# Patient Record
Sex: Male | Born: 1970
Health system: Southern US, Community
[De-identification: ages and names within clinical notes are randomized; demographics above are authoritative.]

## PROBLEM LIST (undated history)

## (undated) DIAGNOSIS — G43909 Migraine, unspecified, not intractable, without status migrainosus: Secondary | ICD-10-CM

---

## 2016-08-07 MED FILL — tiZANidine HCL 4 MG TABS: 4 | 90 days supply | Qty: 270 | Fill #0

## 2016-08-07 MED FILL — CYPROHEPTADINE 4 MG TABLET: 4 | 90 days supply | Qty: 180 | Fill #0

## 2016-10-20 MED FILL — VERAPAMIL ER 120 MG TABLET: 120 | 450 days supply | Qty: 450 | Fill #0

## 2016-11-10 MED FILL — CYPROHEPTADINE 4 MG TABLET: 4 | 90 days supply | Qty: 270 | Fill #0

## 2016-11-10 MED FILL — tiZANidine HCL 4 MG TABS: 4 | 90 days supply | Qty: 180 | Fill #0

## 2017-01-24 DIAGNOSIS — G44029 Chronic cluster headache, not intractable: Secondary | ICD-10-CM | POA: Diagnosis not present

## 2017-01-24 MED FILL — CYPROHEPTADINE HCL 4 MG TAB: 4 | 90 days supply | Qty: 180 | Fill #0

## 2017-01-24 MED FILL — tiZANidine HCL 4 MG TABS: 4 | 90 days supply | Qty: 270 | Fill #0

## 2017-01-24 MED FILL — VERAPAMIL ER 120 MG TABLET: 120 | 90 days supply | Qty: 450 | Fill #0

## 2017-04-24 MED FILL — tiZANidine HCL 4 MG TABS: 4 | 90 days supply | Qty: 270 | Fill #1

## 2017-04-24 MED FILL — CYPROHEPTADINE HCL 4 MG TAB: 4 | 90 days supply | Qty: 180 | Fill #1

## 2017-04-24 MED FILL — VERAPAMIL ER 120 MG TABLET: 120 | 90 days supply | Qty: 450 | Fill #1

## 2017-07-25 MED FILL — CYPROHEPTADINE HCL 4 MG TAB: 4 | 90 days supply | Qty: 180 | Fill #2

## 2017-07-25 MED FILL — VERAPAMIL ER 120 MG TABLET: 120 | 90 days supply | Qty: 450 | Fill #2

## 2017-07-25 MED FILL — tiZANidine HCL 4 MG TABS: 4 | 90 days supply | Qty: 270 | Fill #2

## 2017-09-11 MED FILL — ZOMIG 5 MG NASAL SPRAY: 5 | 30 days supply | Qty: 6 | Fill #0

## 2017-10-05 MED FILL — ZOMIG 5 MG NASAL SPRAY: 5 | 30 days supply | Qty: 6 | Fill #1

## 2017-10-17 MED FILL — CYPROHEPTADINE HCL 4 MG TAB: 4 | 90 days supply | Qty: 180 | Fill #3

## 2017-10-17 MED FILL — VERAPAMIL ER 120 MG TABLET: 120 | 90 days supply | Qty: 450 | Fill #3

## 2017-10-17 MED FILL — tiZANidine HCL 4 MG TABS: 4 | 90 days supply | Qty: 270 | Fill #3

## 2018-01-21 DIAGNOSIS — G44029 Chronic cluster headache, not intractable: Secondary | ICD-10-CM | POA: Diagnosis not present

## 2018-01-21 MED FILL — VERAPAMIL ER 120 MG TABLET: 120 | 90 days supply | Qty: 450 | Fill #0

## 2018-01-21 MED FILL — tiZANidine HCL 4 MG TABS: 4 | 90 days supply | Qty: 270 | Fill #0

## 2018-01-21 MED FILL — CYPROHEPTADINE HCL 4 MG TAB: 4 | 90 days supply | Qty: 180 | Fill #0

## 2018-03-27 DIAGNOSIS — Z7689 Persons encountering health services in other specified circumstances: Secondary | ICD-10-CM | POA: Diagnosis not present

## 2018-03-27 DIAGNOSIS — G44019 Episodic cluster headache, not intractable: Secondary | ICD-10-CM | POA: Diagnosis not present

## 2018-03-27 DIAGNOSIS — Z0000 Encounter for general adult medical examination without abnormal findings: Secondary | ICD-10-CM | POA: Diagnosis not present

## 2018-03-29 DIAGNOSIS — Z0000 Encounter for general adult medical examination without abnormal findings: Secondary | ICD-10-CM | POA: Diagnosis not present

## 2018-03-29 DIAGNOSIS — Z125 Encounter for screening for malignant neoplasm of prostate: Secondary | ICD-10-CM | POA: Diagnosis not present

## 2018-04-30 MED FILL — VERAPAMIL ER 120 MG TABLET: 120 | 90 days supply | Qty: 450 | Fill #1

## 2018-04-30 MED FILL — CYPROHEPTADINE 4 MG TABLET: 4 | 90 days supply | Qty: 180 | Fill #1

## 2018-04-30 MED FILL — tiZANidine HCL 4 MG TABS: 4 | 90 days supply | Qty: 270 | Fill #1

## 2018-07-29 MED FILL — CYPROHEPTADINE 4 MG TABLET: 4 | 90 days supply | Qty: 180 | Fill #2

## 2018-07-29 MED FILL — tiZANidine HCL 4 MG TABS: 4 | 90 days supply | Qty: 270 | Fill #2

## 2018-07-29 MED FILL — VERAPAMIL ER 120 MG TABLET: 120 | 90 days supply | Qty: 450 | Fill #2

## 2018-10-28 MED FILL — CYPROHEPTADINE 4 MG TABLET: 4 | 90 days supply | Qty: 180 | Fill #3

## 2018-10-28 MED FILL — VERAPAMIL ER 120 MG TABLET: 120 | 90 days supply | Qty: 450 | Fill #3

## 2018-10-28 MED FILL — tiZANidine HCL 4 MG TABS: 4 | 90 days supply | Qty: 270 | Fill #3

## 2019-01-27 MED FILL — CYPROHEPTADINE HCL 4 MG TAB: 4 | 90 days supply | Qty: 180 | Fill #0

## 2019-01-27 MED FILL — VERAPAMIL ER 120 MG TABLET: 120 | 90 days supply | Qty: 450 | Fill #0

## 2019-01-27 MED FILL — tiZANidine HCL 4 MG TABS: 4 | 90 days supply | Qty: 270 | Fill #0

## 2019-03-18 DIAGNOSIS — G44029 Chronic cluster headache, not intractable: Secondary | ICD-10-CM | POA: Diagnosis not present

## 2019-04-03 DIAGNOSIS — R635 Abnormal weight gain: Secondary | ICD-10-CM | POA: Diagnosis not present

## 2019-04-03 DIAGNOSIS — Z0000 Encounter for general adult medical examination without abnormal findings: Secondary | ICD-10-CM | POA: Diagnosis not present

## 2019-04-03 DIAGNOSIS — G44009 Cluster headache syndrome, unspecified, not intractable: Secondary | ICD-10-CM | POA: Diagnosis not present

## 2019-04-03 DIAGNOSIS — F17220 Nicotine dependence, chewing tobacco, uncomplicated: Secondary | ICD-10-CM | POA: Diagnosis not present

## 2019-04-03 MED FILL — PHENTERMINE 37.5 MG TABLET: 37.5 | 90 days supply | Qty: 90 | Fill #0

## 2019-04-04 MED FILL — LIDOCAINE HCL 4 % SOLN: 4 | 30 days supply | Qty: 50 | Fill #0

## 2019-04-12 ENCOUNTER — Encounter: Payer: Self-pay | Admitting: Emergency Medicine

## 2019-04-12 ENCOUNTER — Emergency Department: Payer: 59

## 2019-04-12 ENCOUNTER — Emergency Department
Admission: EM | Admit: 2019-04-12 | Discharge: 2019-04-12 | Disposition: A | Discharge status: Home / Self Care | Payer: 59 | Attending: Emergency Medicine | Admitting: Emergency Medicine

## 2019-04-12 ENCOUNTER — Other Ambulatory Visit: Payer: Self-pay

## 2019-04-12 DIAGNOSIS — R030 Elevated blood-pressure reading, without diagnosis of hypertension: Secondary | ICD-10-CM | POA: Diagnosis not present

## 2019-04-12 DIAGNOSIS — Z79899 Other long term (current) drug therapy: Secondary | ICD-10-CM | POA: Diagnosis not present

## 2019-04-12 DIAGNOSIS — R1310 Dysphagia, unspecified: Secondary | ICD-10-CM | POA: Diagnosis not present

## 2019-04-12 DIAGNOSIS — F17228 Nicotine dependence, chewing tobacco, with other nicotine-induced disorders: Secondary | ICD-10-CM | POA: Diagnosis not present

## 2019-04-12 DIAGNOSIS — R070 Pain in throat: Secondary | ICD-10-CM | POA: Insufficient documentation

## 2019-04-12 HISTORY — DX: Migraine, unspecified, not intractable, without status migrainosus: G43.909

## 2019-04-12 LAB — CBC
HCT: 46.1 % (ref 39.0–52.0)
Hemoglobin: 15.6 g/dL (ref 13.0–17.0)
MCH: 31.2 pg (ref 26.0–34.0)
MCHC: 33.8 g/dL (ref 30.0–36.0)
MCV: 92.2 fL (ref 80.0–100.0)
Platelets: 268 K/uL (ref 150–400)
RBC: 5 MIL/uL (ref 4.22–5.81)
RDW: 12 % (ref 11.5–15.5)
WBC: 10.8 K/uL — ABNORMAL HIGH (ref 4.0–10.5)
nRBC: 0 % (ref 0.0–0.2)

## 2019-04-12 LAB — BASIC METABOLIC PANEL WITH GFR
Anion gap: 13 (ref 5–15)
BUN: 12 mg/dL (ref 6–20)
CO2: 20 mmol/L — ABNORMAL LOW (ref 22–32)
Calcium: 9.2 mg/dL (ref 8.9–10.3)
Chloride: 101 mmol/L (ref 98–111)
Creatinine, Ser: 1.52 mg/dL — ABNORMAL HIGH (ref 0.61–1.24)
GFR calc Af Amer: >60 mL/min
GFR calc non Af Amer: 53 mL/min — ABNORMAL LOW
Glucose, Bld: 97 mg/dL (ref 70–99)
Potassium: 4 mmol/L (ref 3.5–5.1)
Sodium: 134 mmol/L — ABNORMAL LOW (ref 135–145)

## 2019-04-12 LAB — BASIC METABOLIC PANEL
Anion gap: 17 — ABNORMAL HIGH (ref 5–15)
BUN: 13 mg/dL (ref 6–20)
CO2: 18 mmol/L — ABNORMAL LOW (ref 22–32)
Calcium: 9.1 mg/dL (ref 8.9–10.3)
Chloride: 100 mmol/L (ref 98–111)
Creatinine, Ser: 1.44 mg/dL — ABNORMAL HIGH (ref 0.61–1.24)
GFR calc Af Amer: >60 mL/min (ref >60)
GFR calc non Af Amer: 57 mL/min — ABNORMAL LOW (ref >60)
Glucose, Bld: 105 mg/dL — ABNORMAL HIGH (ref 70–99)
Potassium: 4.1 mmol/L (ref 3.5–5.1)
Sodium: 135 mmol/L (ref 135–145)

## 2019-04-12 MED ORDER — DEXAMETHASONE SODIUM PHOSPHATE 10 MG/ML IJ SOLN
10.0000 mg | Freq: Once | INTRAMUSCULAR | Status: AC
  Administered 2019-04-12: 18:00:00 10 mg via INTRAVENOUS
  Filled 2019-04-12: qty 1

## 2019-04-12 MED ORDER — IOHEXOL 350 MG/ML SOLN: 75.0000 mL | Freq: Once | INTRAVENOUS | Status: DC | PRN

## 2019-04-12 MED ORDER — IOHEXOL 300 MG/ML  SOLN
75.0000 mL | Freq: Once | INTRAMUSCULAR | Status: AC | PRN
  Administered 2019-04-12: 75 mL via INTRAVENOUS

## 2019-04-12 NOTE — ED Provider Notes (Signed)
[facility] Emergency Department Provider Note   ____________________________________________    I have reviewed the triage vital signs and the nursing notes.   HISTORY  Chief Complaint Dysphagia     HPI Larry Price is a 48 y.o. male who presents with complaints of difficulty swallowing.  Patient reports for the last several hours he has had difficulty swallowing his saliva and has discomfort in his throat.  He reports it is not particularly painful and he is not had any fevers or chills.  He reports this is happened to him several times in the past but typically resolves relatively quickly.  He thinks it may be related to an esophageal spasm.  No nausea or vomiting.  No exposure to COVID-19 patients.  Past Medical History:  Diagnosis Date  . Migraines     There are no active problems to display for this patient.   History reviewed. No pertinent surgical history.  Prior to Admission medications   Medication Sig Start Date End Date Taking? Authorizing Provider  cyproheptadine (PERIACTIN) 4 MG tablet Take 8 mg by mouth Nightly.   Yes [provider]  phentermine (ADIPEX-P) 37.5 MG tablet Take 37.5 mg by mouth every morning. 04/03/19 05/03/19 Yes [provider]  tiZANidine (ZANAFLEX) 4 MG tablet Take 4 mg by mouth Nightly.   Yes [provider]  verapamil (CALAN-SR) 120 MG CR tablet Take 3 tablets by mouth. Pt takes 3 in the morning and 2 in the evening   Yes [provider]     Allergies Penicillins  History reviewed. No pertinent family history.  Social History Social History   Tobacco Use  . Smoking status: Never Smoker  . Smokeless tobacco: Current User  Substance Use Topics  . Alcohol use: Not Currently  . Drug use: Never    Review of Systems  Constitutional: No fever/chills Eyes: No visual changes.  ENT: As above Cardiovascular: Denies chest pain. Respiratory: Denies shortness of breath.  Gastrointestinal: No abdominal pain.  No nausea, no vomiting.   Genitourinary: Negative for dysuria. Musculoskeletal: Negative for back pain. Skin: Negative for rash. Neurological: Negative for headaches or weakness   ____________________________________________   PHYSICAL EXAM:  VITAL SIGNS: ED Triage Vitals  Enc Vitals Group     BP 04/12/19 1541 (!) 148/71     Pulse Rate 04/12/19 1541 97     Resp 04/12/19 1541 18     Temp 04/12/19 1541 97.8 F (36.6 C)     Temp Source 04/12/19 1541 Oral     SpO2 04/12/19 1541 97 %     Weight 04/12/19 1541 112.5 kg (248 lb)     Height 04/12/19 1541 1.778 m (5\' 10" )     Head Circumference --      Peak Flow --      Pain Score 04/12/19 1545 0     Pain Loc --      Pain Edu? --      Excl. in GC? --     Constitutional: Alert and oriented.  Eyes: Conjunctivae are normal.  Head: Atraumatic. Nose: No congestion/rhinnorhea. Mouth/Throat: Mucous membranes are moist.  Pharynx is normal, uvula has been removed apparently Neck:  Painless ROM Cardiovascular: Normal rate, regular rhythm.  Good peripheral circulation. Respiratory: Normal respiratory effort.  No retractions Gastrointestinal: Soft and nontender. No distention.  Musculoskeletal:   Warm and well perfused Neurologic:  Normal speech and language. No gross focal neurologic deficits are appreciated.  Skin:  Skin is warm,  dry and intact. No rash noted. Psychiatric: Mood and affect are normal. Speech and behavior are normal.  ____________________________________________   LABS (all labs ordered are listed, but only abnormal results are displayed)  Labs Reviewed  CBC - Abnormal; Notable for the following components:      Result Value   WBC 10.8 (*)    All other components within normal limits  BASIC METABOLIC PANEL - Abnormal; Notable for the following components:   CO2 18 (*)    Glucose, Bld 105 (*)    Creatinine, Ser 1.44 (*)    GFR calc non Af Amer 57 (*)    Anion gap 17 (*)     All other components within normal limits  BASIC METABOLIC PANEL - Abnormal; Notable for the following components:   Sodium 134 (*)    CO2 20 (*)    Creatinine, Ser 1.52 (*)    GFR calc non Af Amer 53 (*)    All other components within normal limits   ____________________________________________  EKG  None ____________________________________________  RADIOLOGY  None ____________________________________________   PROCEDURES  Procedure(s) performed: No  Procedures   Critical Care performed: No ____________________________________________   INITIAL IMPRESSION / ASSESSMENT AND PLAN / ED COURSE  Pertinent labs & imaging results that were available during my care of the patient were reviewed by me and considered in my medical decision making (see chart for details).  Patient overall well-appearing and in no acute distress.  Differential includes esophageal spasm/achalasia/GERD/infections or mass.  Will obtain labs, imaging and reevaluate  CT scan is reassuring.  Patient's initial BMP a bicarb of 18 and elevated anion gap, feel this is probably erroneous I will repeat this BMP  Second BMP is normal.  Plan for patient is outpatient GI follow-up will likely require endoscopy possibly esophageal manometry.  Discussed with patient and wife who agree with plan    ____________________________________________   FINAL CLINICAL IMPRESSION(S) / ED DIAGNOSES  Final diagnoses:  Dysphagia, unspecified type        Note:  This document was prepared using Dragon voice recognition software and may include unintentional dictation errors.   Jene Every, MD 04/12/19 Barry Brunner

## 2019-04-12 NOTE — ED Notes (Signed)
As per patient feeling like something squeezing in his throat. Able to speak full sentences, able to swallow denies resp issues. Md at bedside to assess, patient lined and labed, awaiting soft tissue ct of neck.

## 2019-04-12 NOTE — ED Triage Notes (Signed)
Pt to ED with c/o of difficulty swallowing. Pt has hx of similar but usually subsides. States starting several hours ago with no relief.

## 2019-04-12 NOTE — ED Notes (Signed)
Dr Cyril Loosen in with patient, pt is sitting upright and leaning forward complaining of throat pressure and tightness, pt is holding and grabbing at his throat. Pt states that he has been having issues with his throat for the past 8 mos, but states for the last 3 hours he has been extremely uncomfortable and feeling like he can't get relief, pt states that he was sweating from the discomfort. 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 Pt states that he never has trouble breathing, and doesn't have difficulty swallowing water, but states at times when he attempts to swallow spontaneously he has difficulty as if his throat doesn't know what to do

## 2019-04-15 ENCOUNTER — Telehealth: Payer: Self-pay | Admitting: Gastroenterology

## 2019-04-15 NOTE — Telephone Encounter (Signed)
We are not allowed to schedule EGD without seeing patient first. He should do a clinic visit or video visit before scheduling egd. I'm in [place] tomorrow, I can see him in clinic first, then request urgent egd for Thursday in [place]  If he is unable to swallow saliva, he should go to ER right away and have on call GI see him  Thanks RV

## 2019-04-15 NOTE — Telephone Encounter (Signed)
Pt is calling to schedule EGD he went to the ER Saturday and has trouble swallowing his own saliva he did not want to schedule Virtual visit  Since his symptoms are so severe he would like a call please

## 2019-04-16 ENCOUNTER — Other Ambulatory Visit: Payer: Self-pay

## 2019-04-16 ENCOUNTER — Ambulatory Visit (INDEPENDENT_AMBULATORY_CARE_PROVIDER_SITE_OTHER): Payer: 59 | Admitting: Gastroenterology

## 2019-04-16 ENCOUNTER — Telehealth: Payer: Self-pay | Admitting: Gastroenterology

## 2019-04-16 ENCOUNTER — Encounter: Payer: Self-pay | Admitting: Gastroenterology

## 2019-04-16 DIAGNOSIS — R1312 Dysphagia, oropharyngeal phase: Secondary | ICD-10-CM

## 2019-04-16 DIAGNOSIS — J381 Polyp of vocal cord and larynx: Secondary | ICD-10-CM | POA: Diagnosis not present

## 2019-04-16 NOTE — Telephone Encounter (Signed)
Left vm for pt to call office and offer 2 pm virtual visit

## 2019-04-16 NOTE — Progress Notes (Signed)
Larry Donathohini Vanga, MD [address]  [address]  [place], [place] [address]  Main: [phone]  Fax: [phone]    Gastroenterology Consultation Video Visit  Referring Provider:     Jerl MinaHedrick, James, MD Primary Care Physician:  Jerl MinaHedrick, James, MD Primary Gastroenterologist:  Dr. Arlyss Repressohini R Price Reason for Consultation:     Choking spells, unable to swallow saliva        HPI:   Larry Price is a 48 y.o. male referred by Dr. Jerl MinaHedrick, James, MD  for consultation & management of unable to swallow saliva, choking spells.  Virtual Visit Video Note  I connected with Larry Price on 04/16/19 at  2:45 PM EDT by video and verified that I am speaking with the correct person using two identifiers.   I discussed the limitations, risks, security and privacy concerns of performing an evaluation and management service by video and the availability of in person appointments. I also discussed with the patient that there may be a patient responsible charge related to this service. The patient expressed understanding and agreed to proceed.  Location of the Patient: Home  Location of the provider: Office  Persons participating in the visit: Patient and provider only   History of Present Illness: Larry Price is a 48 year old gentleman otherwise healthy, history of migraines who initially went to ER on 04/12/2019 due to difficulty swallowing his saliva.  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  He had similar presentation of unable to swallow saliva, severe pressure in his throat, choking spells.  After vocal cord polyps were removed, his symptoms resolved.  He was smoking tobacco at that time which he quit.  He has been experiencing similar symptoms very seldom.  But last 2 weeks, they have become more frequent which brought him to the ER. Soft tissue CT neck in ER was normal. He said he chewed tobacco before the onset of symptoms.  He also says  once he manages to swallow solids or liquids, these go down smoothly without any discomfort in his chest.  He does have intermittent heartburn for which he takes Tums as needed.  He denies having any EGD in the past.  He thinks his current symptoms are related to his vocal cords rather than a GI issue    NSAIDs: none  Antiplts/Anticoagulants/Anti thrombotics: none  GI Procedures: none He denies having any GI surgeries He denies family history of GI malignancy  Past Medical History:  Diagnosis Date  . Migraines     No past surgical history on file.  Current Outpatient Medications:  .  cyproheptadine (PERIACTIN) 4 MG tablet, Take 8 mg by mouth Nightly., Disp: , Rfl:  .  tiZANidine (ZANAFLEX) 4 MG tablet, Take 4 mg by mouth Nightly., Disp: , Rfl:  .  verapamil (CALAN-SR) 120 MG CR tablet, Take 3 tablets by mouth. Pt takes 3 in the morning and 2 in the evening, Disp: , Rfl:  .  phentermine (ADIPEX-P) 37.5 MG tablet, Take 37.5 mg by mouth every morning., Disp: , Rfl:    No family history on file.   Social History   Tobacco Use  . Smoking status: Never Smoker  . Smokeless tobacco: Current User  Substance Use Topics  . Alcohol use: Not Currently  . Drug use: Never    Allergies as of 04/16/2019 - Review Complete 04/16/2019  Allergen Reaction Noted  . Penicillins Hives 04/12/2019  Imaging Studies: Reviewed  Assessment and Plan:   [name] is a 48 y.o. 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  Patient reports similar presentation when he had vocal cord polyps and is concerned about them.  I do not recommend EGD at this time without him evaluated by ENT as I am worried about his airway compromise during anesthesia for EGD.   Placed urgent referral to [facility] In the meantime, advised patient to start omeprazole 20 mg twice daily   Follow Up  Instructions:   I discussed the assessment and treatment plan with the patient. The patient was provided an opportunity to ask questions and all were answered. The patient agreed with the plan and demonstrated an understanding of the instructions.   The patient was advised to call back or seek an in-person evaluation if the symptoms worsen or if the condition fails to improve as anticipated.  I provided 15 minutes of face-to-face time during this encounter.   Follow up in 2 weeks   Arlyss Repress, MD

## 2019-04-16 NOTE — Telephone Encounter (Signed)
Pt has confirmed visual visit on 04/16/2019

## 2019-04-18 DIAGNOSIS — R1310 Dysphagia, unspecified: Secondary | ICD-10-CM | POA: Diagnosis not present

## 2019-04-18 DIAGNOSIS — K219 Gastro-esophageal reflux disease without esophagitis: Secondary | ICD-10-CM | POA: Diagnosis not present

## 2019-04-18 DIAGNOSIS — J385 Laryngeal spasm: Secondary | ICD-10-CM | POA: Diagnosis not present

## 2019-04-18 MED FILL — OMEPRAZOLE 20 MG CPDR: 20 | 30 days supply | Qty: 60 | Fill #0

## 2019-04-28 MED FILL — VERAPAMIL ER 120 MG TABLET: 120 | 90 days supply | Qty: 450 | Fill #0

## 2019-04-28 MED FILL — CYPROHEPTADINE HCL 4 MG TAB: 4 | 90 days supply | Qty: 180 | Fill #0

## 2019-04-28 MED FILL — tiZANidine HCL 4 MG TABS: 4 | 90 days supply | Qty: 270 | Fill #0

## 2019-05-22 DIAGNOSIS — H9319 Tinnitus, unspecified ear: Secondary | ICD-10-CM | POA: Diagnosis not present

## 2019-05-22 DIAGNOSIS — K219 Gastro-esophageal reflux disease without esophagitis: Secondary | ICD-10-CM | POA: Diagnosis not present

## 2019-05-22 DIAGNOSIS — H6123 Impacted cerumen, bilateral: Secondary | ICD-10-CM | POA: Diagnosis not present

## 2019-05-22 DIAGNOSIS — J385 Laryngeal spasm: Secondary | ICD-10-CM | POA: Diagnosis not present

## 2019-05-22 DIAGNOSIS — R1310 Dysphagia, unspecified: Secondary | ICD-10-CM | POA: Diagnosis not present

## 2019-06-03 MED FILL — OMEPRAZOLE 20 MG CPDR: 20 | 30 days supply | Qty: 60 | Fill #1

## 2019-07-07 DIAGNOSIS — E669 Obesity, unspecified: Secondary | ICD-10-CM | POA: Diagnosis not present

## 2019-07-07 MED FILL — PHENTERMINE 37.5 MG TABLET: 37.5 | 30 days supply | Qty: 30 | Fill #0

## 2019-07-24 MED FILL — VERAPAMIL ER 120 MG TABLET: 120 | 90 days supply | Qty: 450 | Fill #1

## 2019-07-24 MED FILL — tiZANidine HCL 4 MG TABS: 4 | 90 days supply | Qty: 270 | Fill #1

## 2019-07-24 MED FILL — CYPROHEPTADINE HCL 4 MG TAB: 4 | 90 days supply | Qty: 180 | Fill #1

## 2019-08-14 MED FILL — PHENTERMINE 37.5 MG TABLET: 37.5 | 30 days supply | Qty: 30 | Fill #1

## 2019-09-30 MED FILL — FLUARIX QUADRIVALENT 0.5 ML: 0.5 | 1 days supply | Qty: 1 | Fill #0

## 2019-10-23 MED FILL — CYPROHEPTADINE HCL 4 MG TAB: 4 | 90 days supply | Qty: 180 | Fill #2

## 2019-10-23 MED FILL — tiZANidine HCL 4 MG TABS: 4 | 90 days supply | Qty: 270 | Fill #2

## 2019-10-23 MED FILL — VERAPAMIL ER 120 MG TABLET: 120 | 90 days supply | Qty: 450 | Fill #2

## 2020-01-27 MED FILL — CYPROHEPTADINE 4 MG TABLET: 4 | 90 days supply | Qty: 180 | Fill #3

## 2020-01-27 MED FILL — tiZANidine HCL 4 MG TABS: 4 | 90 days supply | Qty: 270 | Fill #3

## 2020-01-27 MED FILL — VERAPAMIL ER 120 MG TABLET: 120 | 90 days supply | Qty: 450 | Fill #3

## 2020-03-06 ENCOUNTER — Ambulatory Visit: Payer: Self-pay | Attending: Internal Medicine

## 2020-03-06 DIAGNOSIS — Z23 Encounter for immunization: Secondary | ICD-10-CM

## 2020-03-06 NOTE — Progress Notes (Signed)
   Covid-19 Vaccination Clinic  Name:  Larry Price    MRN: [id number] DOB: [birth year]  03/06/2020  [name] was observed post Covid-19 immunization for 15 minutes without incident. He was provided with Vaccine Information Sheet and instruction to access the V-Safe system.   [name] was instructed to call 911 with any severe reactions post vaccine: Marland Kitchen Difficulty breathing  . Swelling of face and throat  . A fast heartbeat  . A bad rash all over body  . Dizziness and weakness   Immunizations Administered    Name Date Dose VIS Date Route   Pfizer COVID-19 Vaccine 03/06/2020  6:05 PM 0.3 mL 10/31/2019 Intramuscular   Manufacturer: [employer], [employer]   Lot: [id number]   NDC: [address]-0

## 2020-03-15 ENCOUNTER — Other Ambulatory Visit (HOSPITAL_COMMUNITY): Payer: Self-pay | Admitting: Specialist

## 2020-03-30 ENCOUNTER — Ambulatory Visit: Payer: Self-pay | Attending: Internal Medicine

## 2020-03-30 DIAGNOSIS — Z23 Encounter for immunization: Secondary | ICD-10-CM

## 2020-03-30 NOTE — Progress Notes (Signed)
   Covid-19 Vaccination Clinic  Name:  KWABENA STRUTZ    MRN: [id number] DOB: 01/18/1971  03/30/2020  Mr. Crookshanks was observed post Covid-19 immunization for 15 minutes without incident. He was provided with Vaccine Information Sheet and instruction to access the V-Safe system.   Mr. Moga was instructed to call 911 with any severe reactions post vaccine: Marland Kitchen Difficulty breathing  . Swelling of face and throat  . A fast heartbeat  . A bad rash all over body  . Dizziness and weakness   Immunizations Administered    Name Date Dose VIS Date Route   Pfizer COVID-19 Vaccine 03/30/2020  9:28 AM 0.3 mL 01/14/2019 Intramuscular   Manufacturer: [employer], [employer]   Lot: [id number]   NDC: [address]-3

## 2020-07-20 MED FILL — CYPROHEPTADINE 4 MG TABLET: 4 | 90 days supply | Qty: 180 | Fill #1

## 2020-07-20 MED FILL — tiZANidine HCL 4 MG TABS: 4 | 90 days supply | Qty: 270 | Fill #1

## 2020-07-20 MED FILL — VERAPAMIL ER 120 MG TABLET: 120 | 90 days supply | Qty: 450 | Fill #1

## 2020-07-22 IMAGING — CT CT NECK WITH CONTRAST
4 series · 15 of 33 positions shown, 18 images · IV contrast (omnipaque)
Comparison: None.

CLINICAL DATA: Dysphagia beginning several hours ago. Similar
symptoms in the past.

EXAM:
CT NECK WITH CONTRAST
TECHNIQUE: Multidetector CT imaging of the neck was performed using the
standard protocol following the bolus administration of intravenous
contrast.
CONTRAST:  75mL OMNIPAQUE IOHEXOL 300 MG/ML  SOLN

[Series 2: axial neck · axial · 0.67mm/px · z∈[-233,-79]mm · 5 of 117 slices shown, 7 images]
[im 20/117  soft-tissue]
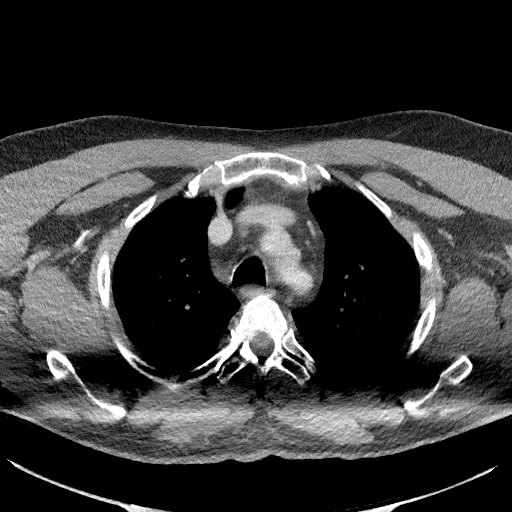
[im 20/117  bone]
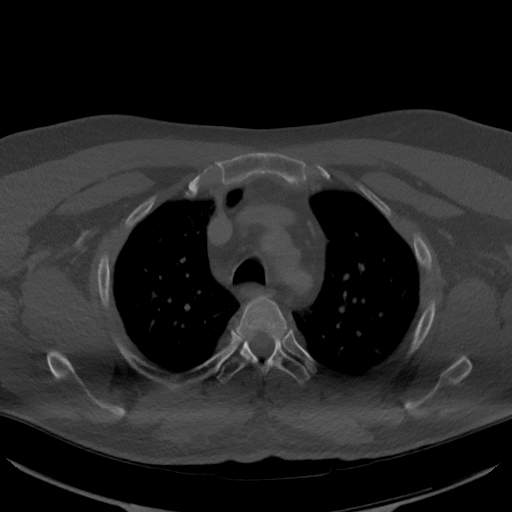
[im 39/117  bone]
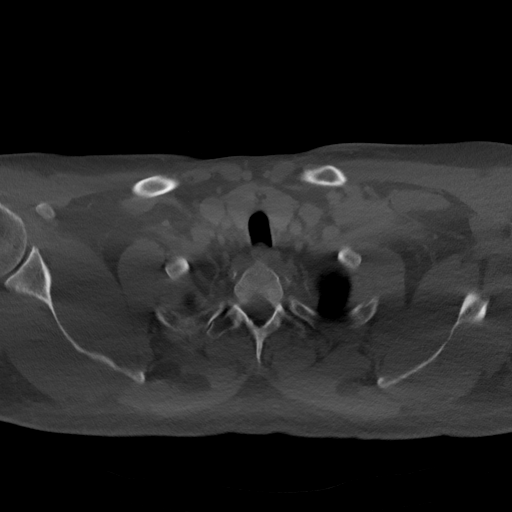
[im 59/117  bone]
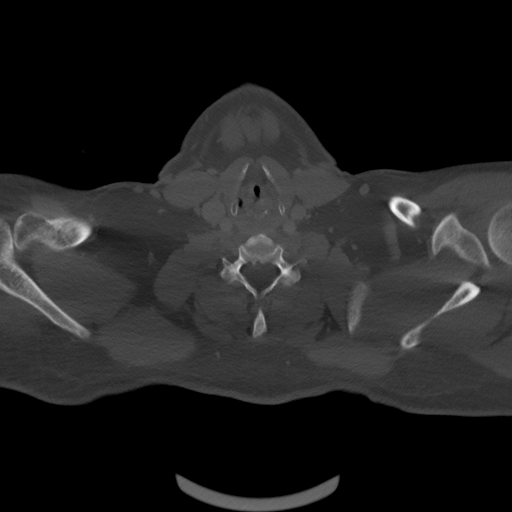
[im 78/117  bone]
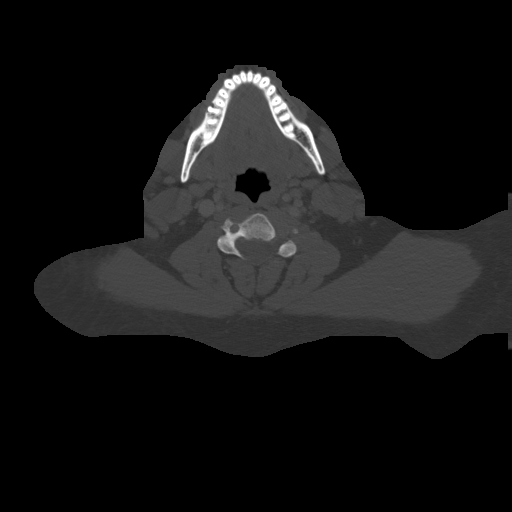
[im 97/117  soft-tissue]
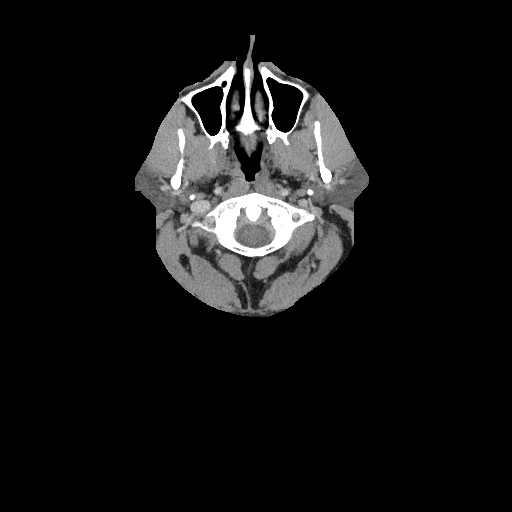
[im 97/117  bone]
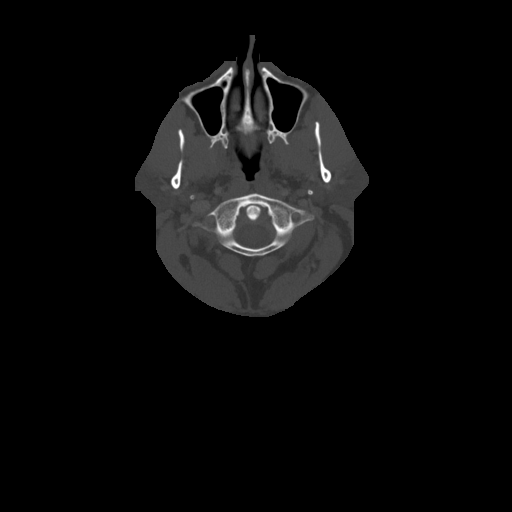

[Series 5: sag neck · sagittal · 0.44mm/px · 5 of 144 slices shown, 6 images]
[im 48/144  bone]
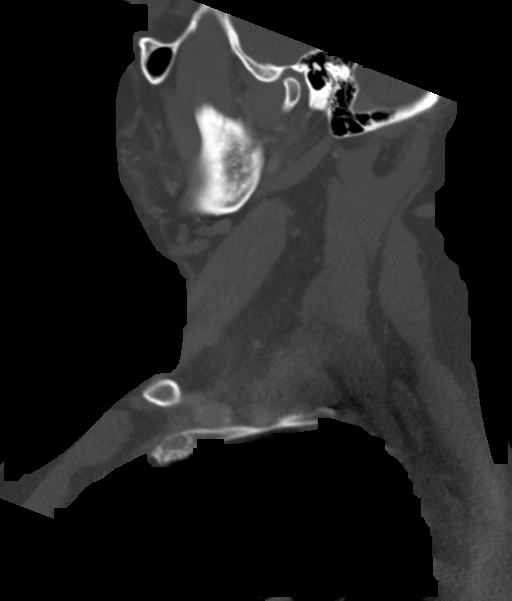
[im 60/144  bone]
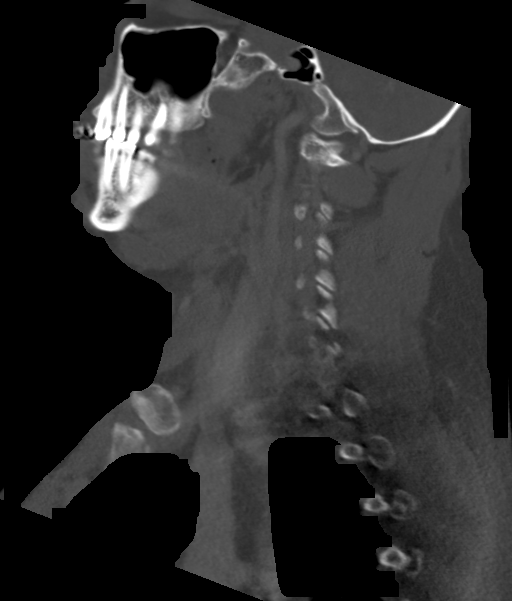
[im 72/144  soft-tissue]
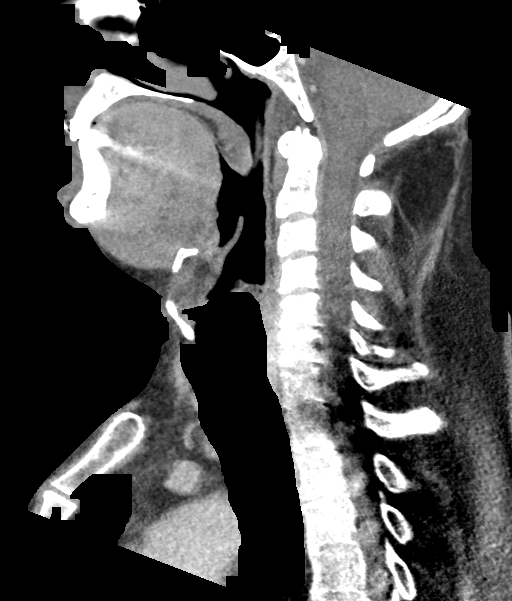
[im 72/144  bone]
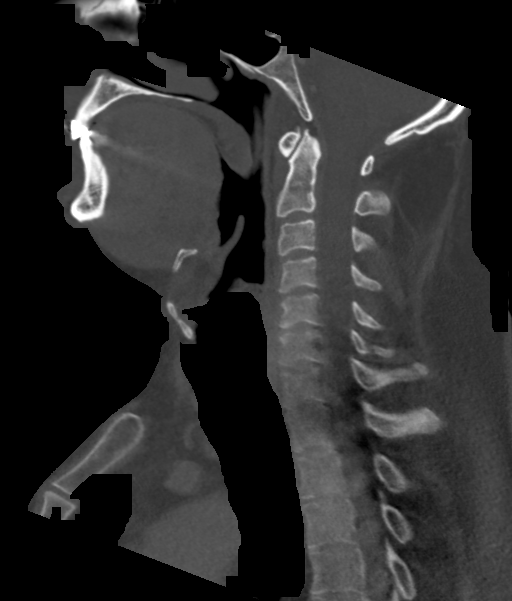
[im 84/144  bone]
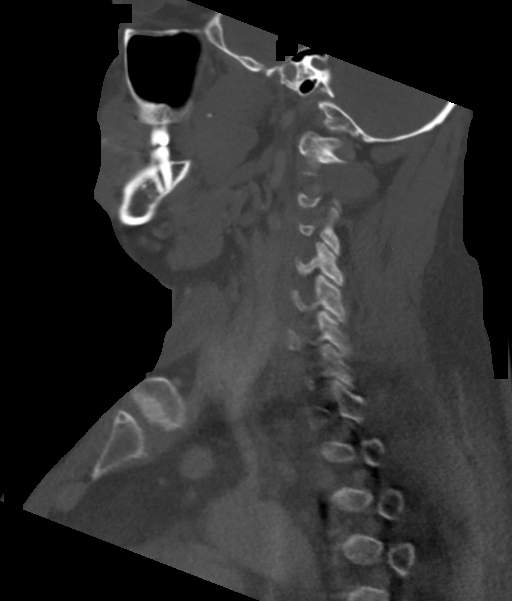
[im 96/144  bone]
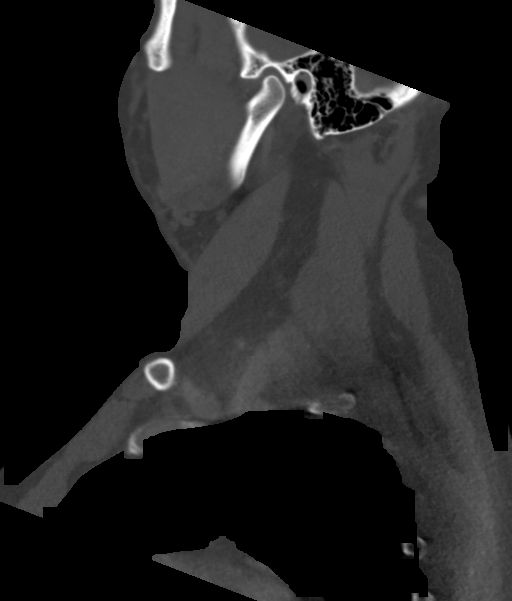

[Series 6: cor neck · coronal · 0.51mm/px · 3 of 107 slices shown]
[im 33/107  bone]
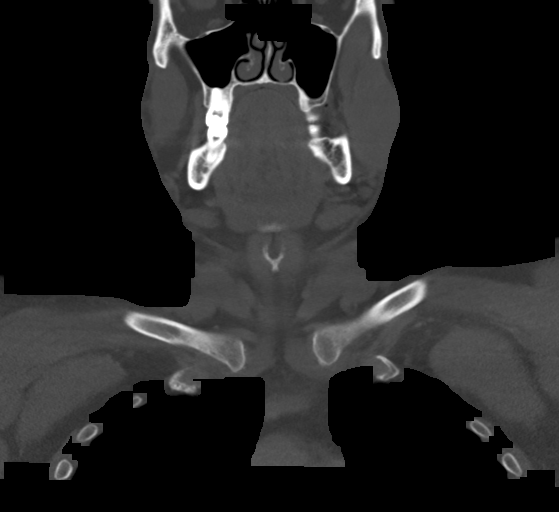
[im 47/107  bone]
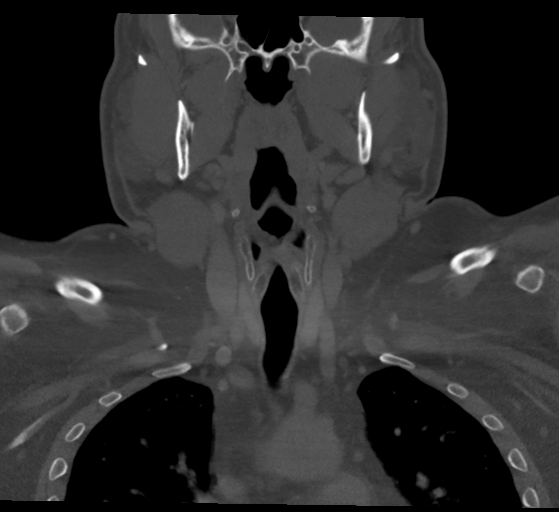
[im 60/107  bone]
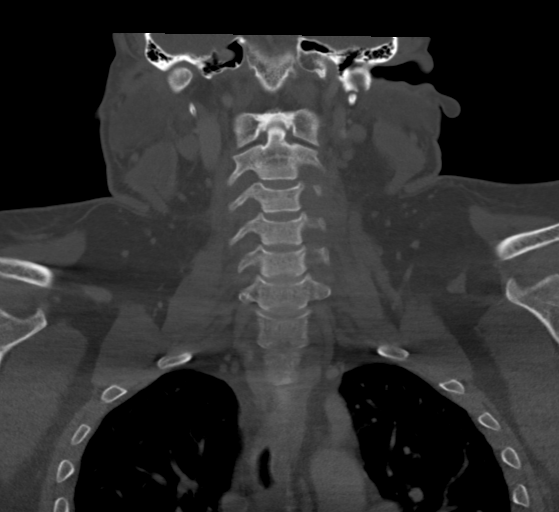

[Series 7: orthogonal ax · axial · 0.44mm/px · z∈[-275,-234]mm · 2 of 132 slices shown]
[im 22/132  bone]
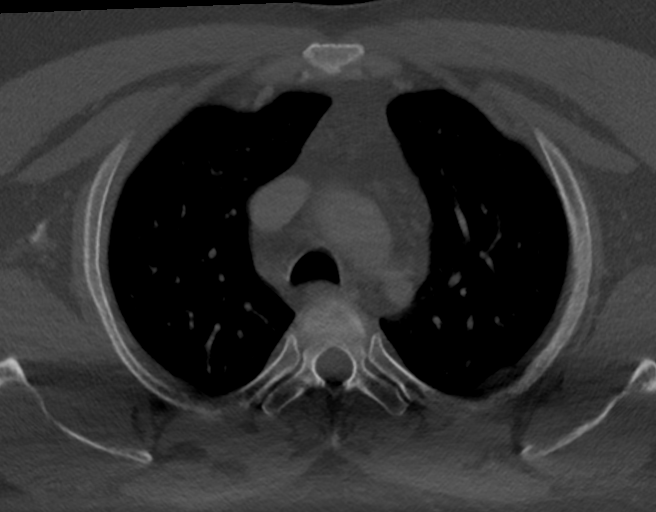
[im 44/132  bone]
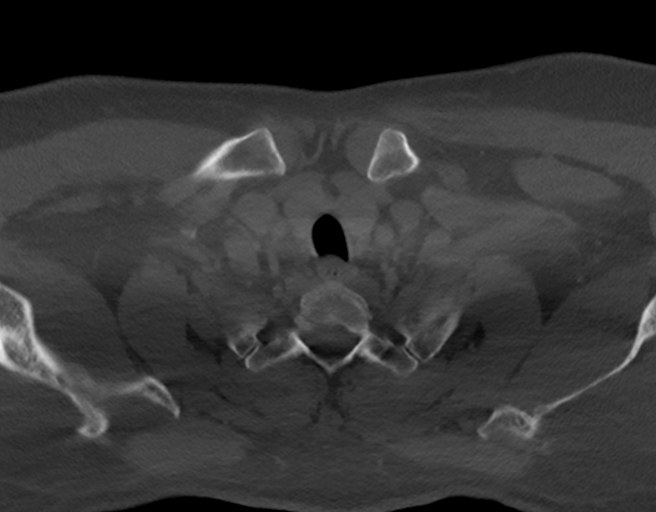

[15 of 33 positions shown; findings below may reference images not displayed]

FINDINGS: Pharynx and larynx: Symmetric pharyngeal soft tissues without
evidence of mass or swelling. Motion artifact through the larynx.
Patent airway. No peritonsillar or retropharyngeal fluid collection.
No foreign body.

Salivary glands: No inflammation, mass, or stone.

Thyroid: Unremarkable.

Lymph nodes: No enlarged or suspicious lymph nodes in the neck.

Vascular: Major vascular structures of the neck are patent.

Limited intracranial: Unremarkable.

Visualized orbits: Unremarkable.

Mastoids and visualized paranasal sinuses: Clear.

Skeleton: No acute osseous abnormality or suspicious osseous lesion.

Upper chest: Clear lung apices.

Other: None.
IMPRESSION: Negative neck CT.

## 2020-08-26 ENCOUNTER — Other Ambulatory Visit (HOSPITAL_COMMUNITY): Payer: Self-pay | Admitting: Internal Medicine

## 2020-08-26 MED FILL — FLUARIX QUADRIVALENT 0.5 ML: 0.5 | 1 days supply | Qty: 1 | Fill #0

## 2020-10-21 MED FILL — VERAPAMIL ER 120 MG TABLET: 120 | 90 days supply | Qty: 450 | Fill #2

## 2020-10-21 MED FILL — tiZANidine HCL 4 MG TABS: 4 | 90 days supply | Qty: 270 | Fill #2

## 2020-10-21 MED FILL — CYPROHEPTADINE 4 MG TABLET: 4 | 90 days supply | Qty: 180 | Fill #2

## 2021-01-10 MED FILL — CYPROHEPTADINE 4 MG TABLET: 4 | 90 days supply | Qty: 180 | Fill #3

## 2021-01-10 MED FILL — tiZANidine HCL 4 MG TABS: 4 | 90 days supply | Qty: 270 | Fill #3

## 2021-01-10 MED FILL — VERAPAMIL ER 120 MG TABLET: 120 | 90 days supply | Qty: 450 | Fill #3

## 2021-03-14 ENCOUNTER — Other Ambulatory Visit (HOSPITAL_COMMUNITY): Payer: Self-pay

## 2021-03-14 MED ORDER — VERAPAMIL HCL ER 120 MG PO TBCR
EXTENDED_RELEASE_TABLET | ORAL | 3 refills | Status: DC
  Filled 2021-04-04: qty 450, 90d supply, fill #0
  Filled 2021-06-24: qty 450, 90d supply, fill #1
  Filled 2021-10-05: qty 450, 90d supply, fill #2
  Filled 2022-01-09: qty 450, 90d supply, fill #3

## 2021-03-14 MED ORDER — LIDOCAINE 4 % EX SOLN: CUTANEOUS | 2 refills | Status: AC

## 2021-03-14 MED ORDER — ZOMIG 5 MG NA SOLN
NASAL | 2 refills | Status: AC
  Filled 2021-03-14 – 2021-04-05 (×3): qty 6, 30d supply, fill #0
  Filled 2022-01-09 – 2022-01-10 (×2): qty 6, 30d supply, fill #1

## 2021-03-14 MED ORDER — TIZANIDINE HCL 4 MG PO TABS
ORAL_TABLET | ORAL | 3 refills | Status: DC
  Filled 2021-04-04: qty 270, 90d supply, fill #0
  Filled 2021-06-24: qty 270, 90d supply, fill #1
  Filled 2021-10-05: qty 270, 90d supply, fill #2
  Filled 2022-01-09: qty 270, 90d supply, fill #3

## 2021-03-14 MED ORDER — CYPROHEPTADINE HCL 4 MG PO TABS
ORAL_TABLET | ORAL | 3 refills | Status: DC
  Filled 2021-04-04: qty 180, 90d supply, fill #0
  Filled 2021-06-24: qty 180, 90d supply, fill #1
  Filled 2021-10-05: qty 180, 90d supply, fill #2
  Filled 2022-01-09: qty 180, 90d supply, fill #3

## 2021-04-04 ENCOUNTER — Other Ambulatory Visit (HOSPITAL_COMMUNITY): Payer: Self-pay

## 2021-04-05 ENCOUNTER — Other Ambulatory Visit (HOSPITAL_COMMUNITY): Payer: Self-pay

## 2021-04-06 ENCOUNTER — Other Ambulatory Visit (HOSPITAL_COMMUNITY): Payer: Self-pay

## 2021-04-07 ENCOUNTER — Other Ambulatory Visit (HOSPITAL_COMMUNITY): Payer: Self-pay

## 2021-06-10 ENCOUNTER — Other Ambulatory Visit (HOSPITAL_COMMUNITY): Payer: Self-pay

## 2021-06-10 MED ORDER — CARESTART COVID-19 HOME TEST VI KIT
PACK | 0 refills | Status: DC
  Filled 2021-06-10: qty 4, 4d supply, fill #0

## 2021-06-24 ENCOUNTER — Other Ambulatory Visit (HOSPITAL_COMMUNITY): Payer: Self-pay

## 2021-06-27 ENCOUNTER — Other Ambulatory Visit (HOSPITAL_COMMUNITY): Payer: Self-pay

## 2021-06-28 ENCOUNTER — Other Ambulatory Visit (HOSPITAL_COMMUNITY): Payer: Self-pay

## 2021-08-05 ENCOUNTER — Other Ambulatory Visit (HOSPITAL_COMMUNITY): Payer: Self-pay

## 2021-08-05 MED ORDER — CARESTART COVID-19 HOME TEST VI KIT
PACK | 0 refills | Status: AC
  Filled 2021-08-05: qty 4, 4d supply, fill #0

## 2021-08-24 ENCOUNTER — Other Ambulatory Visit (HOSPITAL_COMMUNITY): Payer: Self-pay

## 2021-08-24 MED ORDER — INFLUENZA VAC SPLIT QUAD 0.5 ML IM SUSY
PREFILLED_SYRINGE | INTRAMUSCULAR | 0 refills | Status: AC
  Filled 2021-08-24: qty 0.5, 1d supply, fill #0

## 2021-09-06 ENCOUNTER — Other Ambulatory Visit (HOSPITAL_COMMUNITY): Payer: Self-pay

## 2021-10-05 ENCOUNTER — Other Ambulatory Visit (HOSPITAL_COMMUNITY): Payer: Self-pay

## 2021-10-06 ENCOUNTER — Other Ambulatory Visit (HOSPITAL_COMMUNITY): Payer: Self-pay

## 2022-01-09 ENCOUNTER — Other Ambulatory Visit (HOSPITAL_COMMUNITY): Payer: Self-pay

## 2022-01-10 ENCOUNTER — Other Ambulatory Visit (HOSPITAL_COMMUNITY): Payer: Self-pay

## 2022-01-11 ENCOUNTER — Other Ambulatory Visit (HOSPITAL_COMMUNITY): Payer: Self-pay

## 2022-02-20 ENCOUNTER — Other Ambulatory Visit (HOSPITAL_COMMUNITY): Payer: Self-pay

## 2022-02-20 MED ORDER — VERAPAMIL HCL ER 120 MG PO TBCR
EXTENDED_RELEASE_TABLET | ORAL | 3 refills | Status: AC
  Filled 2022-02-20 – 2022-04-10 (×2): qty 450, 90d supply, fill #0
  Filled 2022-07-04: qty 450, 90d supply, fill #1
  Filled 2022-10-16: qty 450, 90d supply, fill #2
  Filled 2023-01-09: qty 150, 30d supply, fill #3
  Filled 2023-02-01: qty 150, 30d supply, fill #4

## 2022-02-20 MED ORDER — CYPROHEPTADINE HCL 4 MG PO TABS
ORAL_TABLET | ORAL | 3 refills | Status: AC
  Filled 2022-02-20 – 2022-04-10 (×2): qty 180, 90d supply, fill #0
  Filled 2022-07-04: qty 180, 90d supply, fill #1
  Filled 2022-10-16: qty 180, 90d supply, fill #2
  Filled 2023-01-09: qty 60, 30d supply, fill #3
  Filled 2023-02-01 – 2023-02-02 (×2): qty 60, 30d supply, fill #4

## 2022-02-20 MED ORDER — TIZANIDINE HCL 4 MG PO TABS
ORAL_TABLET | ORAL | 3 refills | Status: AC
  Filled 2022-04-10: qty 270, 90d supply, fill #0
  Filled 2022-07-04: qty 270, 90d supply, fill #1
  Filled 2022-10-16: qty 270, 90d supply, fill #2
  Filled 2023-01-09: qty 90, 30d supply, fill #3
  Filled 2023-02-01 – 2023-02-02 (×2): qty 90, 30d supply, fill #4

## 2022-02-20 MED ORDER — ZOMIG 5 MG NA SOLN
NASAL | 2 refills | Status: AC
  Filled 2022-02-20: qty 6, 30d supply, fill #0

## 2022-02-21 ENCOUNTER — Other Ambulatory Visit (HOSPITAL_COMMUNITY): Payer: Self-pay

## 2022-04-10 ENCOUNTER — Other Ambulatory Visit (HOSPITAL_COMMUNITY): Payer: Self-pay

## 2022-04-11 ENCOUNTER — Other Ambulatory Visit (HOSPITAL_COMMUNITY): Payer: Self-pay

## 2022-04-12 ENCOUNTER — Other Ambulatory Visit (HOSPITAL_COMMUNITY): Payer: Self-pay

## 2022-07-04 ENCOUNTER — Other Ambulatory Visit (HOSPITAL_COMMUNITY): Payer: Self-pay

## 2022-07-05 ENCOUNTER — Other Ambulatory Visit (HOSPITAL_COMMUNITY): Payer: Self-pay

## 2022-07-25 ENCOUNTER — Other Ambulatory Visit (HOSPITAL_COMMUNITY): Payer: Self-pay

## 2022-09-18 ENCOUNTER — Other Ambulatory Visit (HOSPITAL_COMMUNITY): Payer: Self-pay

## 2022-09-18 MED ORDER — INFLUENZA VAC SPLIT QUAD 0.5 ML IM SUSY
0.5000 mL | PREFILLED_SYRINGE | INTRAMUSCULAR | 0 refills | Status: AC
  Filled 2022-09-18: qty 0.5, 1d supply, fill #0

## 2022-09-25 ENCOUNTER — Other Ambulatory Visit (HOSPITAL_COMMUNITY): Payer: Self-pay

## 2022-10-16 ENCOUNTER — Other Ambulatory Visit (HOSPITAL_COMMUNITY): Payer: Self-pay

## 2022-10-17 ENCOUNTER — Other Ambulatory Visit (HOSPITAL_COMMUNITY): Payer: Self-pay

## 2022-10-19 ENCOUNTER — Other Ambulatory Visit (HOSPITAL_COMMUNITY): Payer: Self-pay

## 2022-10-27 ENCOUNTER — Other Ambulatory Visit (HOSPITAL_COMMUNITY): Payer: Self-pay

## 2022-12-04 ENCOUNTER — Other Ambulatory Visit (HOSPITAL_COMMUNITY): Payer: Self-pay

## 2022-12-19 ENCOUNTER — Other Ambulatory Visit (HOSPITAL_COMMUNITY): Payer: Self-pay

## 2022-12-20 ENCOUNTER — Other Ambulatory Visit (HOSPITAL_COMMUNITY): Payer: Self-pay

## 2022-12-27 ENCOUNTER — Other Ambulatory Visit (HOSPITAL_COMMUNITY): Payer: Self-pay

## 2023-01-09 ENCOUNTER — Other Ambulatory Visit (HOSPITAL_COMMUNITY): Payer: Self-pay

## 2023-01-23 ENCOUNTER — Other Ambulatory Visit (HOSPITAL_COMMUNITY): Payer: Self-pay

## 2023-01-23 MED ORDER — FLUOCINONIDE 0.05 % EX GEL
CUTANEOUS | 0 refills | Status: AC
  Filled 2023-01-23: qty 30, 20d supply, fill #0

## 2023-02-01 ENCOUNTER — Other Ambulatory Visit (HOSPITAL_COMMUNITY): Payer: Self-pay

## 2023-02-02 ENCOUNTER — Other Ambulatory Visit: Payer: Self-pay

## 2023-03-08 ENCOUNTER — Other Ambulatory Visit (HOSPITAL_COMMUNITY): Payer: Self-pay

## 2023-03-12 ENCOUNTER — Other Ambulatory Visit (HOSPITAL_COMMUNITY): Payer: Self-pay

## 2023-03-12 MED ORDER — CYPROHEPTADINE HCL 4 MG PO TABS
8.0000 mg | ORAL_TABLET | Freq: Every day | ORAL | 0 refills | Status: DC
  Filled 2023-03-12: qty 180, 90d supply, fill #0

## 2023-03-12 MED ORDER — VERAPAMIL HCL ER 120 MG PO TBCR
EXTENDED_RELEASE_TABLET | ORAL | 0 refills | Status: DC
  Filled 2023-03-12: qty 450, 90d supply, fill #0

## 2023-03-12 MED ORDER — TIZANIDINE HCL 4 MG PO TABS
12.0000 mg | ORAL_TABLET | Freq: Every day | ORAL | 0 refills | Status: DC
  Filled 2023-03-12: qty 270, 90d supply, fill #0

## 2023-03-13 ENCOUNTER — Other Ambulatory Visit (HOSPITAL_COMMUNITY): Payer: Self-pay

## 2023-03-23 ENCOUNTER — Other Ambulatory Visit (HOSPITAL_COMMUNITY): Payer: Self-pay

## 2023-04-27 ENCOUNTER — Other Ambulatory Visit (HOSPITAL_COMMUNITY): Payer: Self-pay

## 2023-04-28 ENCOUNTER — Other Ambulatory Visit (HOSPITAL_COMMUNITY): Payer: Self-pay

## 2023-04-30 ENCOUNTER — Other Ambulatory Visit (HOSPITAL_COMMUNITY): Payer: Self-pay

## 2023-05-10 ENCOUNTER — Other Ambulatory Visit (HOSPITAL_COMMUNITY): Payer: Self-pay

## 2023-05-10 MED ORDER — ZOMIG 5 MG NA SOLN
1.0000 | NASAL | 2 refills | Status: AC
  Filled 2023-05-12: qty 6, 30d supply, fill #0

## 2023-05-10 MED ORDER — TIZANIDINE HCL 4 MG PO TABS
12.0000 mg | ORAL_TABLET | Freq: Every day | ORAL | 3 refills | Status: AC
  Filled 2023-05-10: qty 90, 30d supply, fill #0
  Filled 2023-06-29: qty 90, 30d supply, fill #1
  Filled 2023-08-03: qty 90, 30d supply, fill #2
  Filled 2023-09-05: qty 90, 30d supply, fill #3
  Filled 2023-10-05: qty 90, 30d supply, fill #4
  Filled 2023-11-05: qty 90, 30d supply, fill #5
  Filled 2023-12-01: qty 90, 30d supply, fill #6
  Filled 2024-01-02: qty 90, 30d supply, fill #7

## 2023-05-10 MED ORDER — CYPROHEPTADINE HCL 4 MG PO TABS
8.0000 mg | ORAL_TABLET | Freq: Every day | ORAL | 3 refills | Status: AC
  Filled 2023-05-10: qty 60, 30d supply, fill #0
  Filled 2023-06-29: qty 60, 30d supply, fill #1
  Filled 2023-08-03: qty 60, 30d supply, fill #2
  Filled 2023-09-05: qty 60, 30d supply, fill #3
  Filled 2023-10-05: qty 60, 30d supply, fill #4
  Filled 2023-11-05: qty 60, 30d supply, fill #5
  Filled 2023-12-01: qty 60, 30d supply, fill #6
  Filled 2024-01-02: qty 60, 30d supply, fill #7

## 2023-05-10 MED ORDER — VERAPAMIL HCL ER 120 MG PO TBCR
EXTENDED_RELEASE_TABLET | ORAL | 0 refills | Status: DC
  Filled 2023-05-10: qty 90, 18d supply, fill #0
  Filled 2023-05-11: qty 150, 30d supply, fill #0
  Filled 2023-05-11: qty 60, 32d supply, fill #0
  Filled 2023-06-08: qty 60, 1d supply, fill #1
  Filled 2023-06-08: qty 60, 20d supply, fill #1
  Filled 2023-06-08: qty 67, 22d supply, fill #1
  Filled 2023-06-08: qty 90, 29d supply, fill #1
  Filled 2023-07-09: qty 150, 30d supply, fill #2

## 2023-05-11 ENCOUNTER — Other Ambulatory Visit (HOSPITAL_COMMUNITY): Payer: Self-pay

## 2023-05-12 ENCOUNTER — Other Ambulatory Visit (HOSPITAL_COMMUNITY): Payer: Self-pay

## 2023-05-14 ENCOUNTER — Other Ambulatory Visit: Payer: Self-pay

## 2023-05-16 ENCOUNTER — Other Ambulatory Visit (HOSPITAL_COMMUNITY): Payer: Self-pay

## 2023-05-21 ENCOUNTER — Other Ambulatory Visit: Payer: Self-pay

## 2023-05-25 ENCOUNTER — Other Ambulatory Visit: Payer: Self-pay

## 2023-06-01 ENCOUNTER — Other Ambulatory Visit (HOSPITAL_COMMUNITY): Payer: Self-pay

## 2023-06-08 ENCOUNTER — Other Ambulatory Visit (HOSPITAL_COMMUNITY): Payer: Self-pay

## 2023-06-14 ENCOUNTER — Other Ambulatory Visit (HOSPITAL_COMMUNITY): Payer: Self-pay

## 2023-06-29 ENCOUNTER — Other Ambulatory Visit (HOSPITAL_COMMUNITY): Payer: Self-pay

## 2023-07-09 ENCOUNTER — Other Ambulatory Visit (HOSPITAL_COMMUNITY): Payer: Self-pay

## 2023-07-09 ENCOUNTER — Other Ambulatory Visit: Payer: Self-pay

## 2023-08-11 ENCOUNTER — Other Ambulatory Visit (HOSPITAL_COMMUNITY): Payer: Self-pay

## 2023-08-13 ENCOUNTER — Other Ambulatory Visit (HOSPITAL_COMMUNITY): Payer: Self-pay

## 2023-08-16 ENCOUNTER — Other Ambulatory Visit (HOSPITAL_COMMUNITY): Payer: Self-pay

## 2023-08-16 MED ORDER — VERAPAMIL HCL ER 120 MG PO TBCR
EXTENDED_RELEASE_TABLET | ORAL | 2 refills | Status: AC
Start: 2023-05-10 — End: ?
  Filled 2023-08-16: qty 25, 5d supply, fill #0
  Filled 2023-08-16: qty 125, 25d supply, fill #0
  Filled 2023-09-08: qty 150, 30d supply, fill #1
  Filled 2023-10-22: qty 150, 30d supply, fill #2
  Filled 2023-11-28 – 2024-01-07 (×2): qty 150, 30d supply, fill #3

## 2023-08-17 ENCOUNTER — Other Ambulatory Visit (HOSPITAL_COMMUNITY): Payer: Self-pay

## 2023-09-08 ENCOUNTER — Other Ambulatory Visit: Payer: Self-pay

## 2023-09-18 ENCOUNTER — Other Ambulatory Visit (HOSPITAL_COMMUNITY): Payer: Self-pay

## 2023-09-20 ENCOUNTER — Other Ambulatory Visit: Payer: Self-pay

## 2023-09-20 ENCOUNTER — Other Ambulatory Visit (HOSPITAL_COMMUNITY): Payer: Self-pay

## 2023-09-20 MED ORDER — INFLUENZA VIRUS VACC SPLIT PF (FLUZONE) 0.5 ML IM SUSY
0.5000 mL | PREFILLED_SYRINGE | Freq: Once | INTRAMUSCULAR | 0 refills | Status: AC
  Filled 2023-09-20: qty 0.5, 1d supply, fill #0

## 2023-09-21 ENCOUNTER — Other Ambulatory Visit (HOSPITAL_COMMUNITY): Payer: Self-pay

## 2023-09-24 ENCOUNTER — Other Ambulatory Visit (HOSPITAL_COMMUNITY): Payer: Self-pay

## 2023-10-09 ENCOUNTER — Other Ambulatory Visit (HOSPITAL_COMMUNITY): Payer: Self-pay

## 2023-10-31 ENCOUNTER — Other Ambulatory Visit: Payer: Self-pay

## 2023-11-01 ENCOUNTER — Other Ambulatory Visit: Payer: Self-pay

## 2023-11-05 ENCOUNTER — Other Ambulatory Visit: Payer: Self-pay

## 2023-11-28 ENCOUNTER — Other Ambulatory Visit (HOSPITAL_COMMUNITY): Payer: Self-pay

## 2023-12-10 ENCOUNTER — Other Ambulatory Visit (HOSPITAL_COMMUNITY): Payer: Self-pay

## 2024-01-02 ENCOUNTER — Other Ambulatory Visit (HOSPITAL_COMMUNITY): Payer: Self-pay

## 2024-01-07 ENCOUNTER — Other Ambulatory Visit (HOSPITAL_COMMUNITY): Payer: Self-pay

## 2024-01-14 ENCOUNTER — Other Ambulatory Visit (HOSPITAL_COMMUNITY): Payer: Self-pay

## 2024-01-18 ENCOUNTER — Other Ambulatory Visit (HOSPITAL_COMMUNITY): Payer: Self-pay

## 2024-09-16 ENCOUNTER — Other Ambulatory Visit (HOSPITAL_COMMUNITY): Payer: Self-pay

## 2024-09-16 MED ORDER — FLUZONE 0.5 ML IM SUSY
0.5000 mL | PREFILLED_SYRINGE | Freq: Once | INTRAMUSCULAR | 0 refills | Status: AC
  Filled 2024-09-16: qty 0.5, 1d supply, fill #0

## 2024-09-25 ENCOUNTER — Other Ambulatory Visit (HOSPITAL_COMMUNITY): Payer: Self-pay
